# Patient Record
Sex: Female | Born: 1985
Health system: Southern US, Community
[De-identification: ages and names within clinical notes are randomized; demographics above are authoritative.]

## PROBLEM LIST (undated history)

## (undated) DIAGNOSIS — T7840XA Allergy, unspecified, initial encounter: Secondary | ICD-10-CM

## (undated) DIAGNOSIS — N390 Urinary tract infection, site not specified: Secondary | ICD-10-CM

## (undated) DIAGNOSIS — B019 Varicella without complication: Secondary | ICD-10-CM

## (undated) DIAGNOSIS — N926 Irregular menstruation, unspecified: Secondary | ICD-10-CM

## (undated) DIAGNOSIS — J301 Allergic rhinitis due to pollen: Secondary | ICD-10-CM

## (undated) HISTORY — DX: Irregular menstruation, unspecified: N92.6

## (undated) HISTORY — DX: Urinary tract infection, site not specified: N39.0

## (undated) HISTORY — DX: Allergic rhinitis due to pollen: J30.1

## (undated) HISTORY — DX: Varicella without complication: B01.9

## (undated) HISTORY — DX: Allergy, unspecified, initial encounter: T78.40XA

---
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
## 2006-05-17 ENCOUNTER — Emergency Department (HOSPITAL_COMMUNITY): Admission: EM | Admit: 2006-05-17 | Discharge: 2006-05-17 | Payer: Self-pay | Admitting: Family Medicine

## 2007-08-07 ENCOUNTER — Emergency Department (HOSPITAL_COMMUNITY): Admission: EM | Admit: 2007-08-07 | Discharge: 2007-08-07 | Payer: Self-pay | Admitting: Emergency Medicine

## 2009-05-25 ENCOUNTER — Emergency Department (HOSPITAL_COMMUNITY): Admission: EM | Admit: 2009-05-25 | Discharge: 2009-05-25 | Payer: Self-pay | Admitting: Family Medicine

## 2011-03-11 ENCOUNTER — Inpatient Hospital Stay (HOSPITAL_COMMUNITY)
Admission: RE | Admit: 2011-03-11 | Discharge: 2011-03-11 | Disposition: A | Discharge status: Home / Self Care | Payer: Medicaid Other | Source: Ambulatory Visit

## 2011-03-11 ENCOUNTER — Inpatient Hospital Stay (INDEPENDENT_AMBULATORY_CARE_PROVIDER_SITE_OTHER)
Admission: RE | Admit: 2011-03-11 | Discharge: 2011-03-11 | Disposition: A | Discharge status: Home / Self Care | Payer: Medicaid Other | Source: Ambulatory Visit | Attending: Family Medicine | Admitting: Family Medicine

## 2011-03-11 DIAGNOSIS — J029 Acute pharyngitis, unspecified: Secondary | ICD-10-CM

## 2011-04-05 NOTE — Discharge Summary (Signed)
NAMELISSY, DEUSER             ACCOUNT NO.:  [id number]   MEDICAL RECORD NO.:  [id number]          PATIENT TYPE:  INP   LOCATION:  9106                          FACILITY:  [facility]   PHYSICIAN:  Malachi Pro. Ambrose Mantle, M.D. DATE OF BIRTH:  [birth year]   DATE OF ADMISSION:  05/18/2005  DATE OF DISCHARGE:  05/21/2005                                 DISCHARGE SUMMARY   This 25 year old white female para 0, gravida 1, 38+ weeks by 11-week  ultrasound with EDC/ May 29, 2005 presented with complaints of regular  contractions, no vaginal bleeding or rupture of membranes, good fetal  movement.  She was evaluated in maternity admission unit.  She was 9 cm  dilated with bulging bat of water.   Prenatal course, past medical history, surgical history were unremarkable.  Blood group and type O positive, negative antibody, RPR nonreactive, rubella  immune, hepatitis B surface antigen negative, HIV negative.  GC and  chlamydia negative.  Triple screen normal.  One-hour Glucola 107, group B  strep negative.   On admission, her examination showed normal vital signs.  Heart and lungs  were normal.  Abdomen was gravid.  Estimated fetal weight about 7-1/2  pounds.  Contractions every three to four minutes.  Fetal heart tones  reactive.  Cervix 9 cm and completely effaced, vertex at a zero station.  Artificial rupture of membranes produced clear fluid.  Artificial rupture of  membranes was done for augmentation.  The patient progressed to complete  dilatation and pushed well for approximately two hours.  Fetal heart rate  developed variable decelerations with vertex at a +3 station.  Dr. Jackelyn Knife  discussed the risks of assisted delivery.  The patient consented.  The  bladder was drained 30 minutes prior with the vertex at a +3 station, LOA  position, and good epidural.  The M-cup vacuum was applied and with three  pulls the vertex was brought to a +5 station.  Vertex delivered without the  vacuum.  It was a  viable female infant, 8 pounds 0 ounces with Apgars of 8 at  1 and 9 at 5 minutes.  Loose nuchal cord x1 was reduced.  Placenta was  spontaneous and intact.  Small second-degree laceration on the right  repaired with 3-0 Vicryl.  Blood loss was less than 500 mL.  Postpartum, the  patient did well.  She had occasional blood pressures in the 130/90 range.  She had no symptoms of headache or any other signs of pre-eclampsia and on  the second postpartum day was discharged.  Initial hemoglobin was 12.8,  hematocrit 37.5, white count 13,300, platelet count 178,000.  Followup  hemoglobin 10.2, hematocrit 30.2, white count 10,600, platelet count  131,000.  RPR was nonreactive.   FINAL DIAGNOSIS:  Intrauterine pregnancy at 38+ weeks delivered LOA.   OPERATION:  Vacuum assisted vaginal delivery with repair of second degree  laceration.   FINAL CONDITION:  Improved.   DISCHARGE INSTRUCTIONS:  Regular discharge instruction booklet.  The patient  is advised to return to the office in six weeks for followup examination and  Percocet 5/325, 20 tablets,  1 every 4-6 hours as needed for pain is given at  discharge.     TFH/MEDQ  D:  05/21/2005  T:  05/21/2005  Job:  [id number]

## 2011-05-22 ENCOUNTER — Emergency Department (HOSPITAL_COMMUNITY)
Admission: EM | Admit: 2011-05-22 | Discharge: 2011-05-22 | Disposition: A | Discharge status: Home / Self Care | Payer: Medicaid Other | Attending: Emergency Medicine | Admitting: Emergency Medicine

## 2011-05-22 DIAGNOSIS — N12 Tubulo-interstitial nephritis, not specified as acute or chronic: Secondary | ICD-10-CM | POA: Insufficient documentation

## 2011-05-22 DIAGNOSIS — R197 Diarrhea, unspecified: Secondary | ICD-10-CM | POA: Insufficient documentation

## 2011-05-22 DIAGNOSIS — R630 Anorexia: Secondary | ICD-10-CM | POA: Insufficient documentation

## 2011-05-22 DIAGNOSIS — R509 Fever, unspecified: Secondary | ICD-10-CM | POA: Insufficient documentation

## 2011-05-22 DIAGNOSIS — R42 Dizziness and giddiness: Secondary | ICD-10-CM | POA: Insufficient documentation

## 2011-05-22 DIAGNOSIS — R112 Nausea with vomiting, unspecified: Secondary | ICD-10-CM | POA: Insufficient documentation

## 2011-05-22 DIAGNOSIS — R259 Unspecified abnormal involuntary movements: Secondary | ICD-10-CM | POA: Insufficient documentation

## 2011-05-22 DIAGNOSIS — R109 Unspecified abdominal pain: Secondary | ICD-10-CM | POA: Insufficient documentation

## 2011-05-22 LAB — URINALYSIS, ROUTINE W REFLEX MICROSCOPIC
Glucose, UA: NEGATIVE mg/dL
Hgb urine dipstick: NEGATIVE
Ketones, ur: 15 mg/dL — AB
Nitrite: NEGATIVE
Protein, ur: 30 mg/dL — AB
Specific Gravity, Urine: >1.03 — ABNORMAL HIGH (ref 1.005–1.030)
Urobilinogen, UA: 1 mg/dL (ref 0.0–1.0)
pH: 6 (ref 5.0–8.0)

## 2011-05-22 LAB — URINE MICROSCOPIC-ADD ON

## 2011-05-22 LAB — GLUCOSE, CAPILLARY: Glucose-Capillary: 94 mg/dL (ref 70–99)

## 2011-05-22 LAB — POCT PREGNANCY, URINE: Preg Test, Ur: NEGATIVE

## 2013-09-15 ENCOUNTER — Ambulatory Visit (INDEPENDENT_AMBULATORY_CARE_PROVIDER_SITE_OTHER): Payer: BC Managed Care – PPO | Admitting: Family

## 2013-09-15 ENCOUNTER — Encounter: Payer: Self-pay | Admitting: Family

## 2013-09-15 ENCOUNTER — Ambulatory Visit (INDEPENDENT_AMBULATORY_CARE_PROVIDER_SITE_OTHER)
Admission: RE | Admit: 2013-09-15 | Discharge: 2013-09-15 | Disposition: A | Discharge status: Home / Self Care | Payer: BC Managed Care – PPO | Source: Ambulatory Visit | Attending: Family | Admitting: Family

## 2013-09-15 VITALS — BP 108/56 | HR 88 | Ht 62.5 in | Wt 154.0 lb

## 2013-09-15 DIAGNOSIS — M25571 Pain in right ankle and joints of right foot: Secondary | ICD-10-CM

## 2013-09-15 DIAGNOSIS — S99911A Unspecified injury of right ankle, initial encounter: Secondary | ICD-10-CM

## 2013-09-15 DIAGNOSIS — S99919A Unspecified injury of unspecified ankle, initial encounter: Secondary | ICD-10-CM

## 2013-09-15 DIAGNOSIS — S8990XA Unspecified injury of unspecified lower leg, initial encounter: Secondary | ICD-10-CM

## 2013-09-15 DIAGNOSIS — M25579 Pain in unspecified ankle and joints of unspecified foot: Secondary | ICD-10-CM

## 2013-09-15 NOTE — Progress Notes (Signed)
  Subjective:    Patient ID: Annette Pierce, female    DOB: 05-27-1986, 27 y.o.   MRN: [id number]  HPI 27 year old white female, new patient to the practice and to be established. She had concerns of right ankle pain after an injury she sustained in April 2014. Patient reports jump in and about the house spell, sprained her ankle, had significant swelling at that time. She did not seek medical attention. Subsequently, things got better and in July 2014 she rolled her ankle causing more swelling. Since that time, she continued to have stiffness in her right ankle. She has pain typically on a daily basis at rates from 1-5/10. She takes ibuprofen that helps. Pain is typically worse in the twisting her ankle inward.   Review of Systems  Constitutional: Negative.   Respiratory: Negative.   Cardiovascular: Negative.   Musculoskeletal: Positive for arthralgias.       Right ankle pain  Skin: Negative.   Neurological: Negative.   Psychiatric/Behavioral: Negative.    Past Medical History  Diagnosis Date  . Allergy   . Irregular periods     History   Social History  . Marital Status: Single    Spouse Name: N/A    Number of Children: N/A  . Years of Education: N/A   Occupational History  . Not on file.   Social History Main Topics  . Smoking status: Never Smoker   . Smokeless tobacco: Not on file  . Alcohol Use: Yes  . Drug Use: No  . Sexual Activity: Not on file   Other Topics Concern  . Not on file   Social History Narrative  . No narrative on file    History reviewed. No pertinent past surgical history.  No family history on file.  No Known Allergies  No current outpatient prescriptions on file prior to visit.   No current facility-administered medications on file prior to visit.    BP 108/56  Pulse 88  Ht 5' 2.5" (1.588 m)  Wt 154 lb (69.854 kg)  BMI 27.7 kg/m2  LMP 08/29/2014chart    Objective:   Physical Exam  Constitutional: She is oriented to person,  place, and time. She appears well-developed and well-nourished.  Neck: Normal range of motion. Neck supple.  Cardiovascular: Normal rate, regular rhythm and normal heart sounds.   Pulmonary/Chest: Effort normal and breath sounds normal.  Musculoskeletal: She exhibits tenderness.  Right ankle pain  Neurological: She is alert and oriented to person, place, and time.  Skin: Skin is warm and dry.  Psychiatric: She has a normal mood and affect.          Assessment & Plan:  Assessment: 1. Right ankle pain 2. Right ankle injury  Plan: X-ray of the right ankle and an outpatient and results. Encourage ankle exercises to help strengthen the tendons and ligaments. followup pending x-ray. Consider referral to orthopedics if necessary.

## 2013-09-15 NOTE — Patient Instructions (Signed)
Ankle Exercises for Rehabilitation Following ankle injuries, it is as important to follow your caregivers instructions for regaining full use of your ankle as it was to follow the initial treatment plan following the injury. The following are some suggestions for exercises and treatment, which can be done to help you regain full use of your ankle as soon as possible.  Follow all instructions regarding physical therapy.  Before exercising, it may be helpful to use heat on the muscles or joint being exercised. This loosens up the muscles and tendons (cord like structure) and decreases chances of injury during your exercises. If this is not possible just begin your exercises slowly to gradually warm up.  Stand on your toes several times per dayto strengthen the calf muscles. These are the muscles in the back of your leg between the knee and the heel. The cord you can feel just above the heel is the Achilles tendon. Rise up on your toes several times repeating this three to four times per day. Do not exercise to the point of pain. If pain starts to develop, decrease the exercise until you are comfortable again.  Do range of motion exercises. This means moving the ankle in all directions. Practice writing the alphabet with your toes in the air. Do not increase beyond a range that is comfortable.  Increase the strength of the muscles in the front of your leg by raising your toes and foot straight up in the air. Repeat this exercise as you did the calf exercise with the same warnings. This also help to stretch your muscles.  Stretch your calf muscles also by leaning against a wall with your hands in front of you. Put your feet a few feet from the wall and bend your knees until you feel the muscles in your calves become tight.  After exercising it may be helpful to put ice on the ankle to prevent swelling and improve rehabilitation. This may be done for 15 to 20 minutes following your exercises. If exercising  is being done in the work place, this may not always be possible.  Taping an ankle injury may be helpful to give added support following an injury. It also may help prevent re-injury. This may be true if you are in training or in a conditioning program. You and your caregiver can decide on the best course of action to follow. Document Released: 11/01/2000 Document Revised: 01/27/2012 Document Reviewed: 10/29/2008 [employer] Patient Information 2014 [employer].  

## 2015-02-14 IMAGING — CR DG ANKLE COMPLETE 3+V*R*
3 series · 3 of 3 positions shown · non-contrast
Comparison: None.

CLINICAL DATA: Fall, pain, swelling.

EXAM:
RIGHT ANKLE - COMPLETE 3+ VIEW

[view not recorded (1 of 3)]
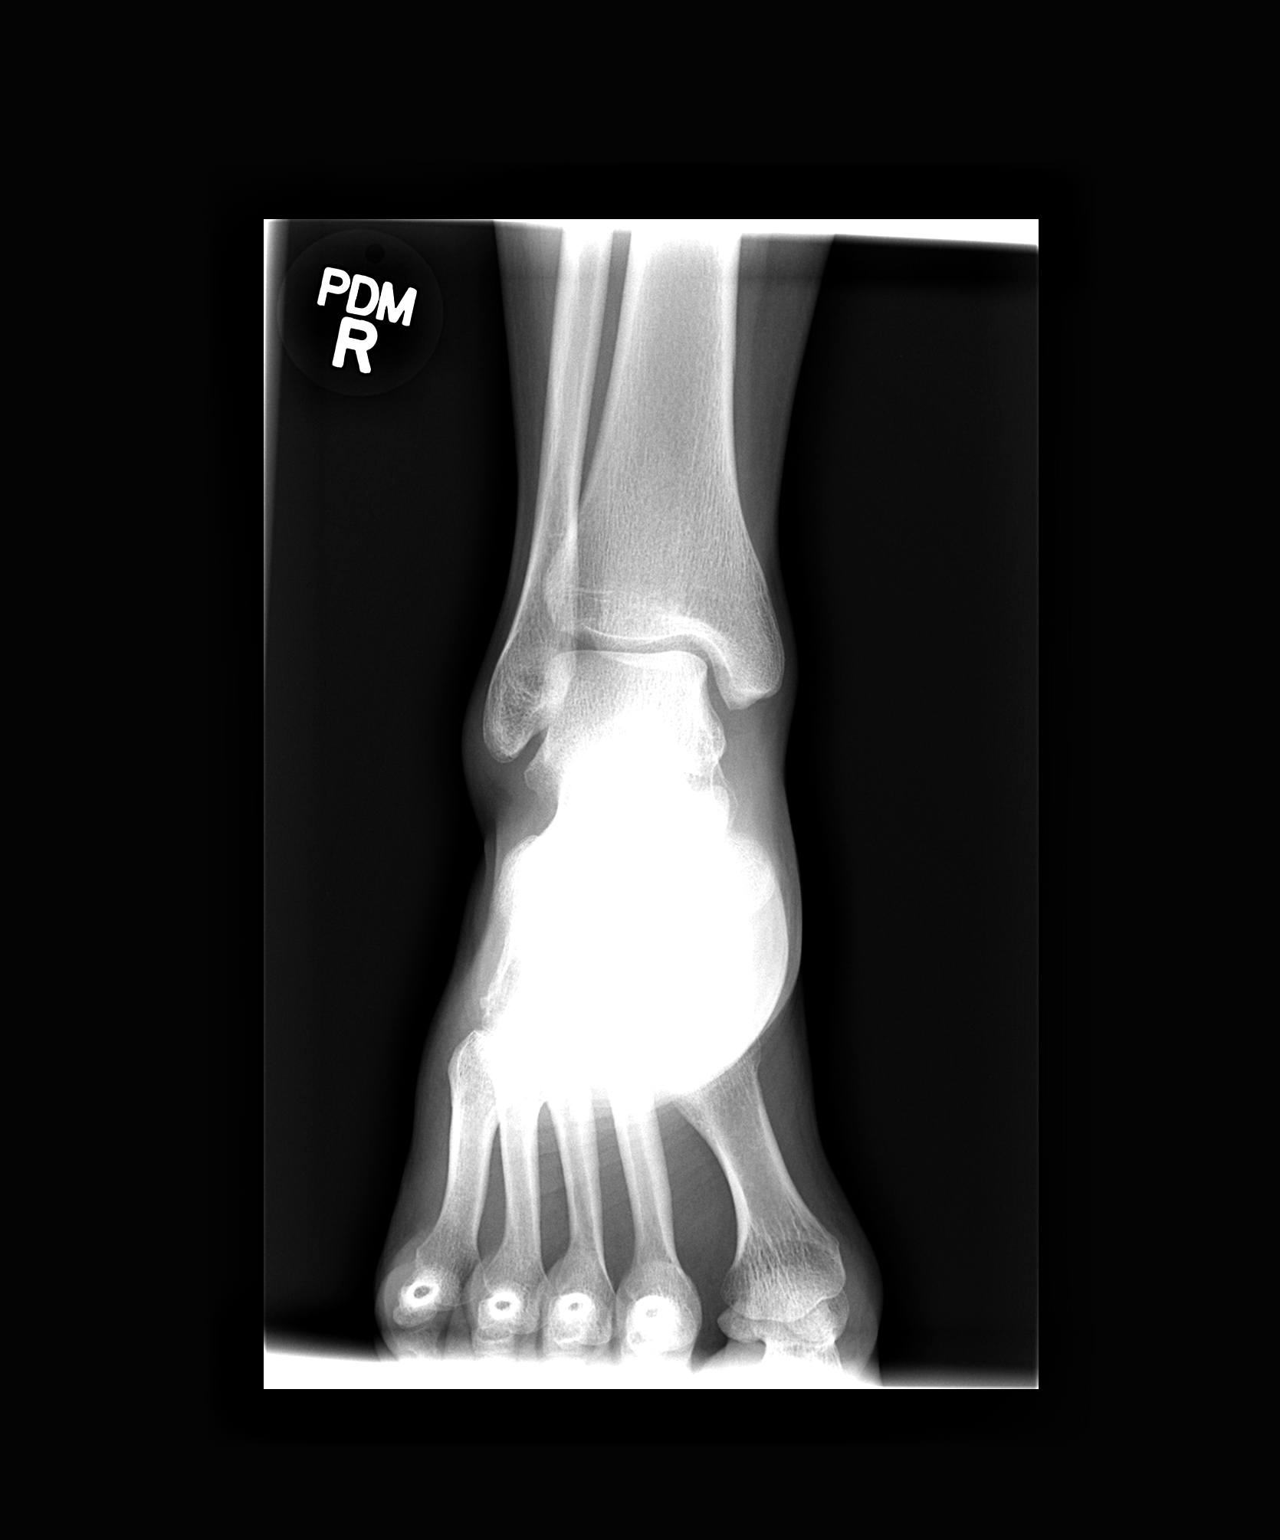

[view not recorded (2 of 3)]
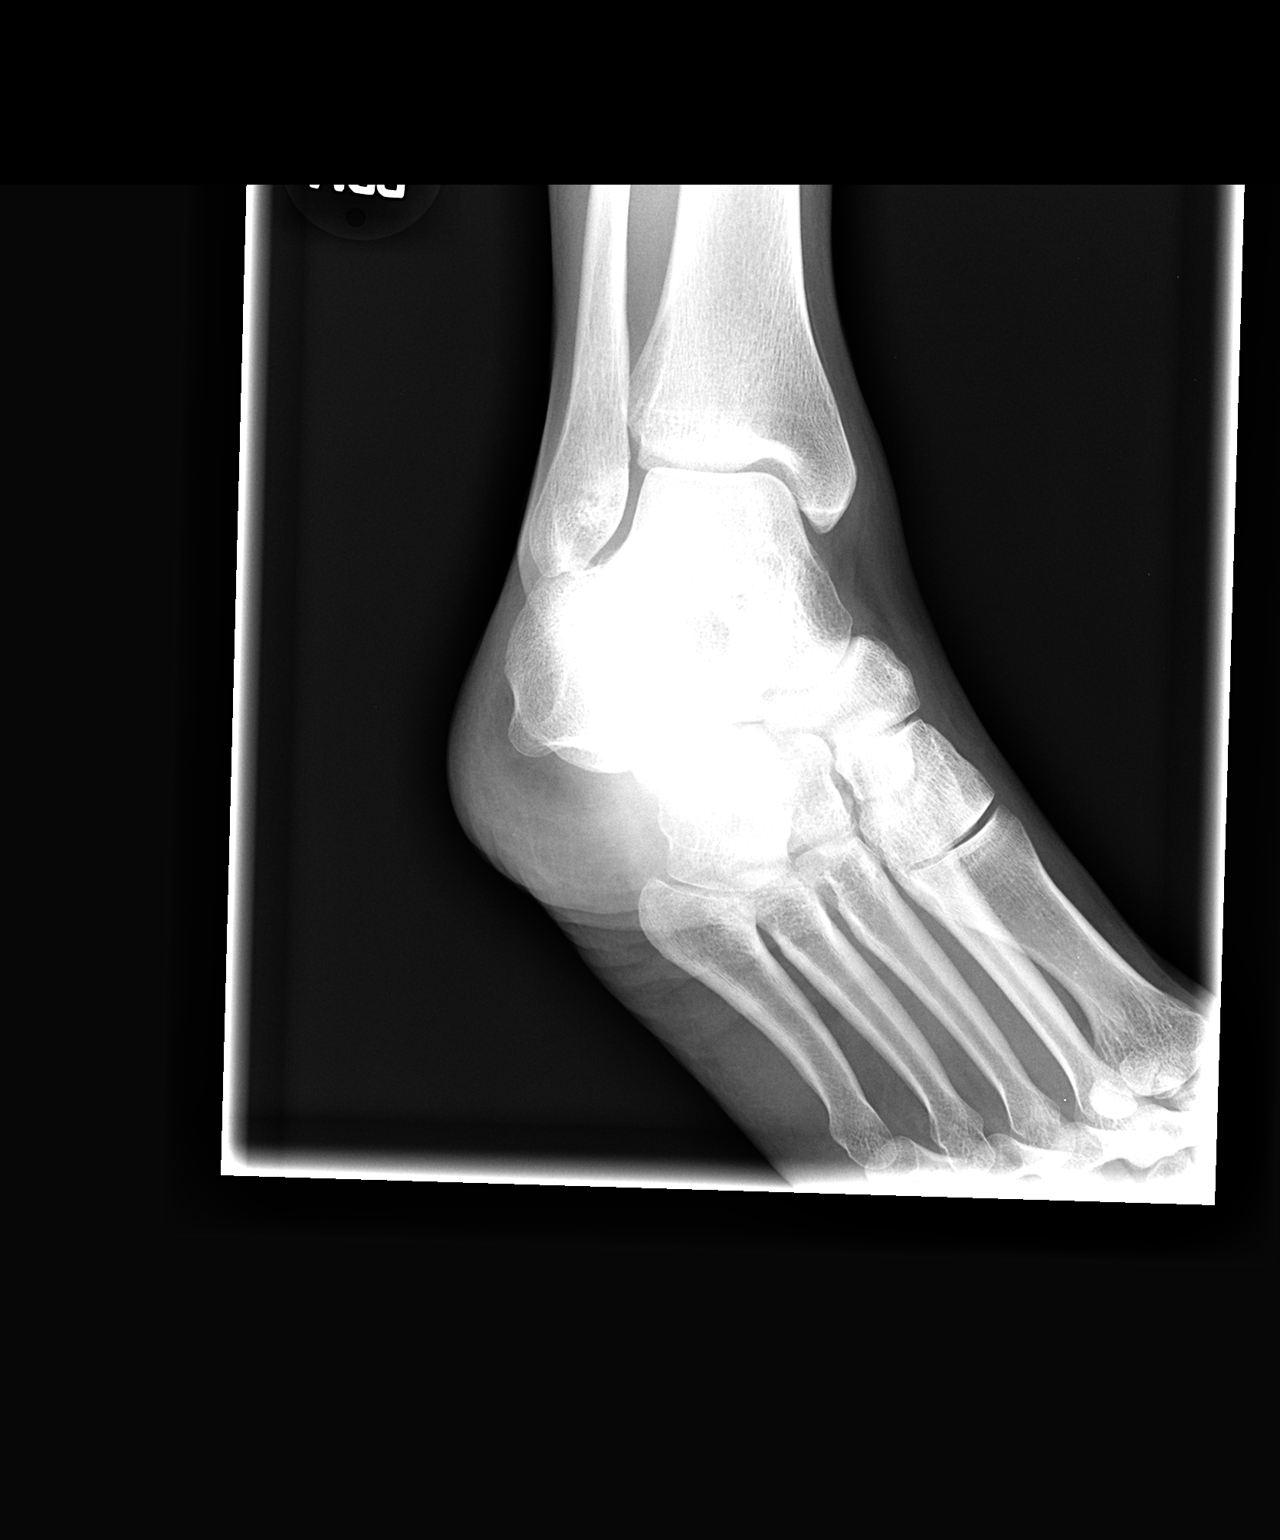

[view not recorded (3 of 3)]
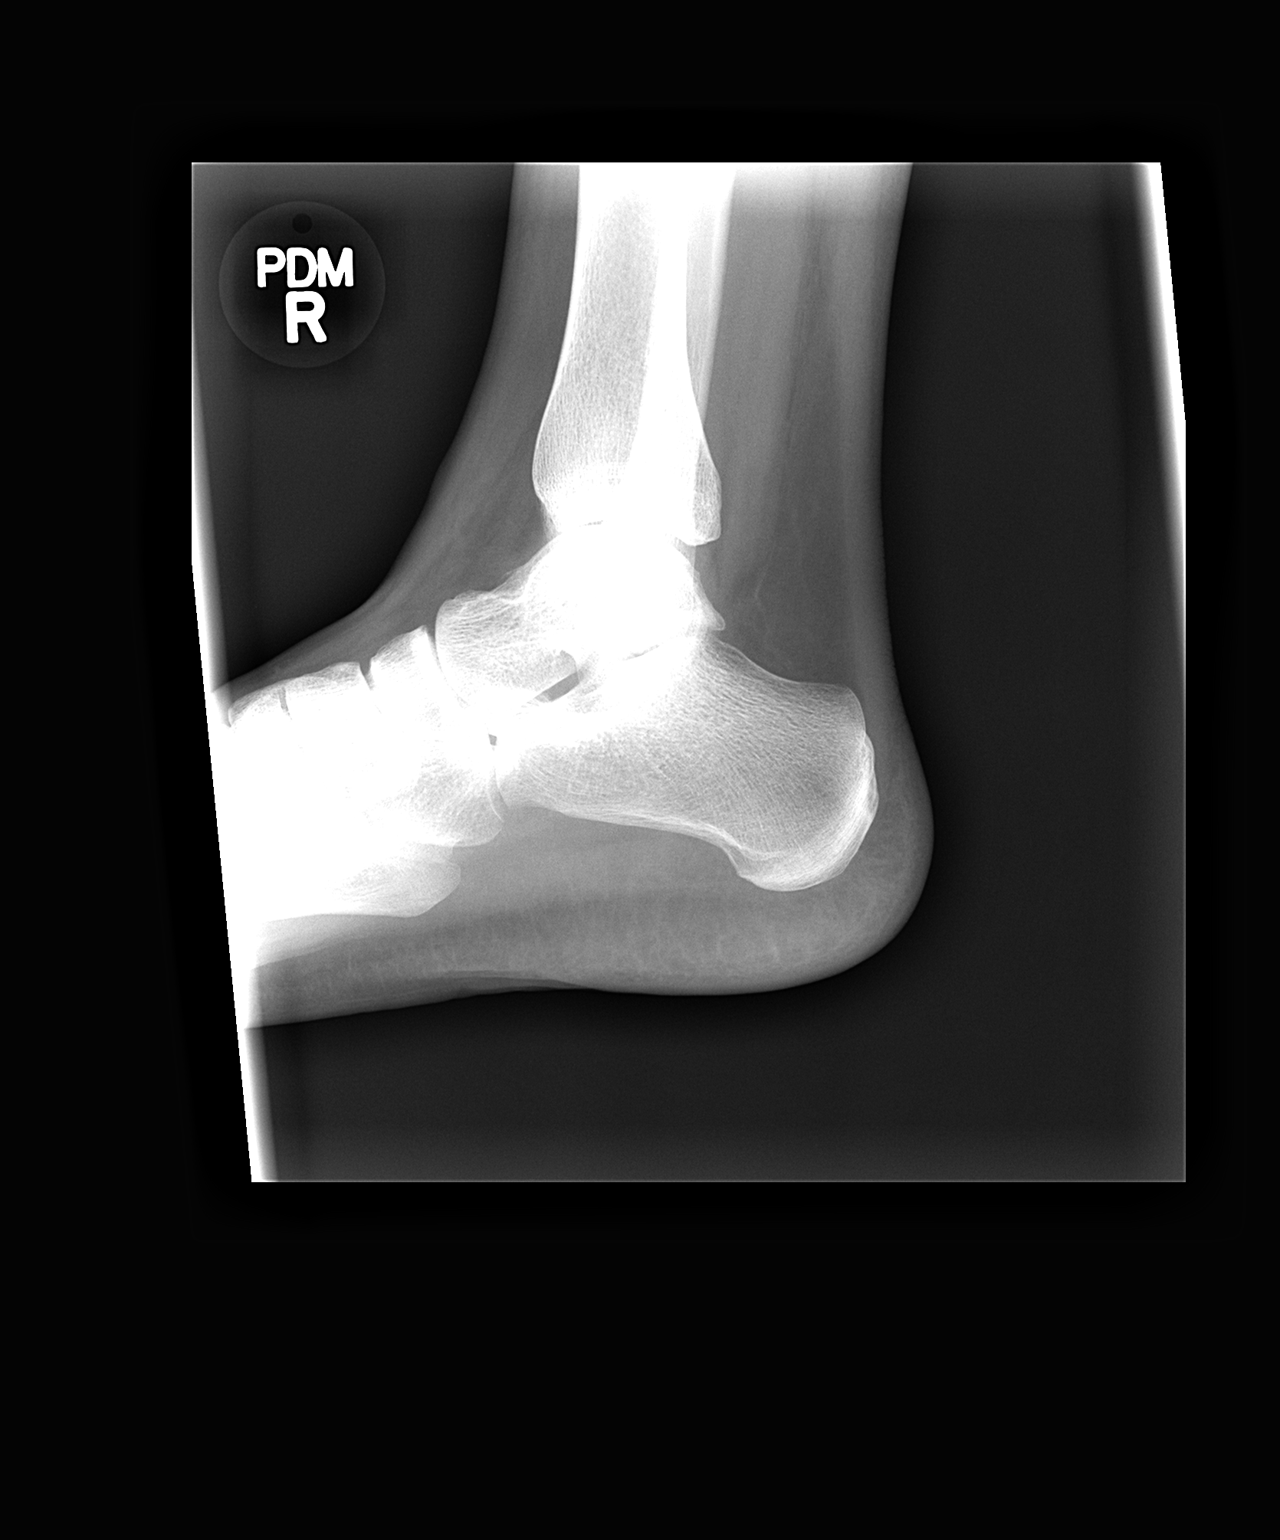

[3 of 3 positions shown; findings below may reference images not displayed]

FINDINGS: There is no evidence of fracture, dislocation, or joint effusion.
There is no evidence of arthropathy or other focal bone abnormality.
Soft tissues are unremarkable.
IMPRESSION: Negative.

## 2015-08-28 ENCOUNTER — Emergency Department (INDEPENDENT_AMBULATORY_CARE_PROVIDER_SITE_OTHER)
Admission: EM | Admit: 2015-08-28 | Discharge: 2015-08-28 | Disposition: A | Discharge status: Home / Self Care | Payer: BLUE CROSS/BLUE SHIELD | Source: Home / Self Care | Attending: Family Medicine | Admitting: Family Medicine

## 2015-08-28 ENCOUNTER — Encounter (HOSPITAL_COMMUNITY): Payer: Self-pay | Admitting: *Deleted

## 2015-08-28 DIAGNOSIS — S40811A Abrasion of right upper arm, initial encounter: Secondary | ICD-10-CM | POA: Diagnosis not present

## 2015-08-28 LAB — POCT PREGNANCY, URINE: PREG TEST UR: NEGATIVE

## 2015-08-28 MED ORDER — TETANUS-DIPHTH-ACELL PERTUSSIS 5-2.5-18.5 LF-MCG/0.5 IM SUSP
0.5000 mL | Freq: Once | INTRAMUSCULAR | Status: AC
  Administered 2015-08-28: 0.5 mL via INTRAMUSCULAR

## 2015-08-28 MED ORDER — TETANUS-DIPHTH-ACELL PERTUSSIS 5-2.5-18.5 LF-MCG/0.5 IM SUSP
INTRAMUSCULAR | Status: AC
  Filled 2015-08-28: qty 0.5

## 2015-08-28 NOTE — ED Notes (Signed)
Pt  Sustained  A  Superficial  laceration to  r   Arm  Today  From a  Rusty  Dumpster  Bleeding has  Subsided

## 2015-08-28 NOTE — ED Provider Notes (Signed)
CSN: [id number]     Arrival date & time 08/28/15  1646 History   First MD Initiated Contact with Patient 08/28/15 1810     Chief Complaint  Patient presents with  . Laceration   (Consider location/radiation/quality/duration/timing/severity/associated sxs/prior Treatment) Patient is a 29 y.o. female presenting with skin laceration. The history is provided by the patient.  Laceration Location:  Shoulder/arm Shoulder/arm laceration location:  R forearm Length (cm):  5 Depth:  Cutaneous Quality comment:  Scrape to skin Bleeding: controlled   Laceration mechanism:  Metal edge (throwing box into dumpster and scraped right forearm.) Pain details:    Severity:  No pain   Progression:  Unchanged Foreign body present:  No foreign bodies Relieved by:  Nothing Worsened by:  Nothing tried Ineffective treatments:  None tried Tetanus status:  Out of date   Past Medical History  Diagnosis Date  . Allergy   . Irregular periods    No past surgical history on file. No family history on file. Social History  Substance Use Topics  . Smoking status: Never Smoker   . Smokeless tobacco: Not on file  . Alcohol Use: Yes   OB History    No data available     Review of Systems  Skin: Positive for wound.  All other systems reviewed and are negative.   Allergies  Review of patient's allergies indicates no known allergies.  Home Medications   Prior to Admission medications   Not on File   Meds Ordered and Administered this Visit   Medications  Tdap (BOOSTRIX) injection 0.5 mL (not administered)    BP 131/91 mmHg  Pulse 74  Temp(Src) 98.3 F (36.8 C) (Oral)  Resp 16  SpO2 99% No data found.   Physical Exam  Constitutional: She is oriented to person, place, and time. She appears well-developed and well-nourished.  Musculoskeletal: She exhibits no tenderness.  Neurological: She is alert and oriented to person, place, and time.  Skin: Skin is warm and dry.  5cm superficial  scrape to forearm, no subq penetration, no bleeding.  Nursing note and vitals reviewed.   ED Course  Procedures (including critical care time)  Labs Review Labs Reviewed  POCT PREGNANCY, URINE    Imaging Review No results found.   Visual Acuity Review  Right Eye Distance:   Left Eye Distance:   Bilateral Distance:    Right Eye Near:   Left Eye Near:    Bilateral Near:         MDM   1. Arm abrasion, right, initial encounter        Linna Hoff, MD 08/28/15 [phone]

## 2015-08-28 NOTE — Discharge Instructions (Signed)
Wash regularly, you had a tetanus booster, return as needed.

## 2015-09-27 ENCOUNTER — Encounter: Payer: Self-pay | Admitting: Family Medicine

## 2015-09-27 ENCOUNTER — Telehealth: Payer: Self-pay | Admitting: Family

## 2015-09-27 ENCOUNTER — Ambulatory Visit (INDEPENDENT_AMBULATORY_CARE_PROVIDER_SITE_OTHER): Payer: BLUE CROSS/BLUE SHIELD | Admitting: Family Medicine

## 2015-09-27 VITALS — BP 101/73 | Temp 98.4°F | Ht 62.5 in | Wt 147.0 lb

## 2015-09-27 DIAGNOSIS — T63301A Toxic effect of unspecified spider venom, accidental (unintentional), initial encounter: Secondary | ICD-10-CM

## 2015-09-27 DIAGNOSIS — R51 Headache: Secondary | ICD-10-CM

## 2015-09-27 DIAGNOSIS — R519 Headache, unspecified: Secondary | ICD-10-CM

## 2015-09-27 MED ORDER — TRAMADOL HCL 50 MG PO TABS: 100.0000 mg | ORAL_TABLET | Freq: Three times a day (TID) | ORAL | Status: DC | PRN

## 2015-09-27 MED ORDER — DOXYCYCLINE HYCLATE 100 MG PO TABS: 100.0000 mg | ORAL_TABLET | Freq: Two times a day (BID) | ORAL | Status: DC

## 2015-09-27 NOTE — Progress Notes (Signed)
Pre visit review using our clinic review tool, if applicable. No additional management support is needed unless otherwise documented below in the visit note. 

## 2015-09-27 NOTE — Progress Notes (Signed)
   Subjective:    Patient ID: Annette Pierce, female    DOB: 1986-01-21, 29 y.o.   MRN: [id number]  HPI Here for a bite on the left ankle that occurred about 2 weeks ago, and also for headaches that started about one week ago. She was outside in a yard when she felt a sharp bite on the left ankle. She never saw what bit her but she developed a pustule on the site. There was never a rash at the site. No fevers or nausea. This opened and drained about 4 days ago, and at that same time the headaches started. These are centered over the left temple and they generalize over the entire head. No light sensitivity. She has tried Excedrin Migraines, Advils, and allergy meds with no relief.    Review of Systems  Constitutional: Negative.   Eyes: Negative.   Respiratory: Negative.   Skin: Positive for wound.  Neurological: Positive for headaches.       Objective:   Physical Exam  Constitutional: She is oriented to person, place, and time. She appears well-developed and well-nourished. No distress.  HENT:  Head: Normocephalic and atraumatic.  Right Ear: External ear normal.  Left Ear: External ear normal.  Nose: Nose normal.  Eyes: Conjunctivae are normal.  Pulmonary/Chest: Effort normal and breath sounds normal.  Lymphadenopathy:    She has no cervical adenopathy.  Neurological: She is alert and oriented to person, place, and time. She has normal reflexes. No cranial nerve deficit. She exhibits normal muscle tone. Coordination normal.  Skin:  Small red macular spot on the medial left ankle           Assessment & Plan:  She has likely received a spider bite and we will cover this with Doxycycline. It is not clear if this is related to the bite or not. Use Tramadol for pain

## 2015-09-27 NOTE — Telephone Encounter (Signed)
PLEASE NOTE: All timestamps contained within this report are represented as [place] Standard Time. CONFIDENTIALTY NOTICE: This fax transmission is intended only for the addressee. It contains information that is legally privileged, confidential or otherwise protected from use or disclosure. If you are not the intended recipient, you are strictly prohibited from reviewing, disclosing, copying using or disseminating any of this information or taking any action in reliance on or regarding this information. If you have received this fax in error, please notify us immediately by telephone so that we can arrange for its return to us. Phone: [phone], Toll-Free: [phone], Fax: [phone] Page: 1 of 1 Call Id: [id number] [facility] - Client TELEPHONE ADVICE RECORD [facility] Patient Name: Annette Pierce DOB: October 12, 1986 Initial Comment Caller States she got bit by something 2 weeks on her foot and the site popped. she has been having migraines since it popped, about a week ago. Nurse Assessment Nurse: Elijah Birkaldwell, RN, Lynda Date/Time (Eastern Time): 09/27/2015 2:28:58 PM Confirm and document reason for call. If symptomatic, describe symptoms. ---Caller states she got bit by something 2 weeks ago on her left foot by ankle and the site popped with pus in it. The bump is still there with scab and is still red & itchy. After it popped, covered it & put antibiotic cream on it. Since then, she has been having constant Migraines for about a week. Has tried ASA, Advil, Excedrin, cold compress. No fever. Has the patient traveled out of the country within the last 30 days? ---Not Applicable Does the patient have any new or worsening symptoms? ---Yes Will a triage be completed? ---Yes Related visit to physician within the last 2 weeks? ---No Does the PT have any chronic conditions? (i.e. diabetes, asthma, etc.) ---No Did the patient indicate they were  pregnant? ---No Guidelines Guideline Title Affirmed Question Affirmed Notes Headache [1] MODERATE headache (e.g., interferes with normal activities) AND [2] present > 24 hours AND [3] unexplained (Exceptions: analgesics not tried, typical migraine, or headache part of viral illness) Final Disposition User See Physician within 24 Hours Keachialdwell, RN, ToysRusLynda Referrals REFERRED TO PCP OFFICE Disagree/Comply: Danella Maiersomply

## 2015-09-28 NOTE — Telephone Encounter (Signed)
Pt saw Dr Clent RidgesFry yesterday 09/27/15 and she is feeling better today

## 2016-07-01 ENCOUNTER — Ambulatory Visit (INDEPENDENT_AMBULATORY_CARE_PROVIDER_SITE_OTHER): Payer: BLUE CROSS/BLUE SHIELD | Admitting: Family Medicine

## 2016-07-01 VITALS — BP 110/80 | HR 71 | Temp 98.2°F | Ht 62.5 in | Wt 151.0 lb

## 2016-07-01 DIAGNOSIS — R21 Rash and other nonspecific skin eruption: Secondary | ICD-10-CM

## 2016-07-01 MED ORDER — TRIAMCINOLONE ACETONIDE 0.1 % EX CREA: 1.0000 "application " | TOPICAL_CREAM | Freq: Two times a day (BID) | CUTANEOUS | 1 refills | Status: DC

## 2016-07-01 NOTE — Progress Notes (Signed)
Pre visit review using our clinic review tool, if applicable. No additional management support is needed unless otherwise documented below in the visit note. 

## 2016-07-01 NOTE — Progress Notes (Signed)
Subjective:     Patient ID: Annette CoxSusan Pierce, female   DOB: 07/25/1986, 30 y.o.   MRN: [id number]  HPI Acute visit for skin rash right upper inner arm. Present for a few weeks. Denies any other areas of rash Rash is pruritic. Has tried over-the-counter hydrocortisone cream without much improvement. Frequently scratches the rash. Exacerbated by heat. Denies any other systemic rash. No other symptoms.  Past Medical History:  Diagnosis Date  . Allergy   . Irregular periods    No past surgical history on file.  reports that she has never smoked. She has never used smokeless tobacco. She reports that she does not drink alcohol or use drugs. family history is not on file. No Known Allergies   Review of Systems  Constitutional: Negative for chills and fever.  Skin: Positive for rash.       Objective:   Physical Exam  Constitutional: She appears well-developed and well-nourished.  Cardiovascular: Normal rate and regular rhythm.   Skin: Rash noted.  Right upper inner arm reveals approximately 3-6 cm area of skin rash which is dry and scaly and excoriated. No vesicles. No pustules. Nontender.       Assessment:     Nonspecific excoriated somewhat lichenified skin rash right upper inner arm    Plan:     -Avoid scratching as much as possible -Triamcinolone 0.1% cream twice daily as needed -Follow-up with primary in 2 weeks if not resolving or improving  Kristian CoveyBruce W Yoshika Vensel MD [facility]

## 2016-07-01 NOTE — Patient Instructions (Signed)
Try to avoid scratching as much as possible. Use the medicated cream twice daily Let us know in 2 weeks if no better.

## 2017-01-30 ENCOUNTER — Ambulatory Visit (INDEPENDENT_AMBULATORY_CARE_PROVIDER_SITE_OTHER): Payer: BLUE CROSS/BLUE SHIELD | Admitting: Family Medicine

## 2017-01-30 ENCOUNTER — Encounter: Payer: Self-pay | Admitting: Family Medicine

## 2017-01-30 VITALS — BP 100/78 | HR 74 | Temp 97.9°F | Wt 153.6 lb

## 2017-01-30 DIAGNOSIS — Z7689 Persons encountering health services in other specified circumstances: Secondary | ICD-10-CM | POA: Diagnosis not present

## 2017-01-30 DIAGNOSIS — J302 Other seasonal allergic rhinitis: Secondary | ICD-10-CM

## 2017-01-30 DIAGNOSIS — K029 Dental caries, unspecified: Secondary | ICD-10-CM | POA: Diagnosis not present

## 2017-01-30 NOTE — Progress Notes (Signed)
Pre visit review using our clinic review tool, if applicable. No additional management support is needed unless otherwise documented below in the visit note. 

## 2017-01-30 NOTE — Patient Instructions (Addendum)
It was a pleasure to meet you today! You can use either Allegra, Claritin, or Zyrtec for symptoms as we discussed. Follow up for a physical and lab work.  Allergic Rhinitis Allergic rhinitis is when the mucous membranes in the nose respond to allergens. Allergens are particles in the air that cause your body to have an allergic reaction. This causes you to release allergic antibodies. Through a chain of events, these eventually cause you to release histamine into the blood stream. Although meant to protect the body, it is this release of histamine that causes your discomfort, such as frequent sneezing, congestion, and an itchy, runny nose. What are the causes? Seasonal allergic rhinitis (hay fever) is caused by pollen allergens that may come from grasses, trees, and weeds. Year-round allergic rhinitis (perennial allergic rhinitis) is caused by allergens such as house dust mites, pet dander, and mold spores. What are the signs or symptoms?  Nasal stuffiness (congestion).  Itchy, runny nose with sneezing and tearing of the eyes. How is this diagnosed? Your health care provider can help you determine the allergen or allergens that trigger your symptoms. If you and your health care provider are unable to determine the allergen, skin or blood testing may be used. Your health care provider will diagnose your condition after taking your health history and performing a physical exam. Your health care provider may assess you for other related conditions, such as asthma, pink eye, or an ear infection. How is this treated? Allergic rhinitis does not have a cure, but it can be controlled by:  Medicines that block allergy symptoms. These may include allergy shots, nasal sprays, and oral antihistamines.  Avoiding the allergen. Hay fever may often be treated with antihistamines in pill or nasal spray forms. Antihistamines block the effects of histamine. There are over-the-counter medicines that may help with  nasal congestion and swelling around the eyes. Check with your health care provider before taking or giving this medicine. If avoiding the allergen or the medicine prescribed do not work, there are many new medicines your health care provider can prescribe. Stronger medicine may be used if initial measures are ineffective. Desensitizing injections can be used if medicine and avoidance does not work. Desensitization is when a patient is given ongoing shots until the body becomes less sensitive to the allergen. Make sure you follow up with your health care provider if problems continue. Follow these instructions at home: It is not possible to completely avoid allergens, but you can reduce your symptoms by taking steps to limit your exposure to them. It helps to know exactly what you are allergic to so that you can avoid your specific triggers. Contact a health care provider if:  You have a fever.  You develop a cough that does not stop easily (persistent).  You have shortness of breath.  You start wheezing.  Symptoms interfere with normal daily activities. This information is not intended to replace advice given to you by your health care provider. Make sure you discuss any questions you have with your health care provider. Document Released: 07/30/2001 Document Revised: 07/05/2016 Document Reviewed: 07/12/2013 Elsevier Interactive Patient Education  2017 [employer]   WE NOW OFFER   [facility] FAST TRACK!!!  SAME DAY Appointments for ACUTE CARE  Such as: Sprains, Injuries, cuts, abrasions, rashes, muscle pain, joint pain, back pain Colds, flu, sore throats, headache, allergies, cough, fever  Ear pain, sinus and eye infections Abdominal pain, nausea, vomiting, diarrhea, upset stomach Animal/insect bites  3 Easy  Ways to Schedule: Walk-In Scheduling Call in scheduling Mychart Sign-up: [email or web address]

## 2017-01-30 NOTE — Progress Notes (Signed)
Patient ID: Annette Pierce, female   DOB: [birth year], 31 y.o.   MRN: [id number]  Patient presents to clinic today to establish care. She reports seeking care rarely and notes that she only sees a PCP when she is sick.   Symptoms of itchy, watery eyes are noted. History of seasonal allergies. She denies fever, chills, sweats, cough, sore throat, post nasal drip, ear pain, or sinus pressure/pain. Treatment with benedryl has provided moderate benefit but causes excessive sleepiness.   Health Maintenance: Dental -- She does not go; Over 10 years ago. No insurance. Will provide local dental community resources Vision -- Last year; She is UTD; Wears contacts Immunizations -- UTD with Tdap; She declines flu influenza today Colonoscopy --None PAP -- Last Pap in Fall 2017.  Followed by gynecology; Pap normal per patient; she reports heavy periods but notes this as "normal" for her and she is followed by gynecology.    ROS  BP 100/78 (BP Location: Left Arm, Patient Position: Sitting, Cuff Size: Normal)   Pulse 74   Temp 97.9 F (36.6 C) (Oral)   Wt 153 lb 9.6 oz (69.7 kg)   LMP 01/23/2017 (Approximate)   SpO2 98%   BMI 27.65 kg/m    Constitutional: No fever, chills, significant weight change, fatigue, weakness or night sweats Eyes: No redness, discharge, pain, blurred vision, double vision, or loss of vision. Positive for itchy, watery eyes ENT/mouth: No nasal congestion, postnasal drainage,epistaxis, purulent discharge, earache, hearing loss, tinnitus ,sore throat , dental pain, or hoarseness   Cardiovascular: no chest pain, palpitations, racing, irregular rhythm, syncope, nausea, sweating, claudication, or edema  Respiratory: No cough, sputum production,hemoptysis,  dyspnea, paroxysmal nocturnal dyspnea, pleuritic chest pain, significant snoring, or  apnea    Gastrointestinal: No heartburn,dysphagia, nausea and vomiting,ominal pain, change in bowels, anorexia, diarrhea, significant  constipation, rectal bleeding, melena,  stool incontinence or jaundice Genitourinary: No dysuria,hematuria, pyuria, frequency, urgency,  incontinence, nocturia, dark urine or flank pain Musculoskeletal: No myalgias or muscle cramping, joint stiffness, joint swelling, joint color change, weakness, or cyanosis Dermatologic: No rash, pruritus, urticaria, or change in color or temperature of skin Neurologic: No headache, vertigo, limb weakness, tremor, gait disturbance, seizures, memory loss, numbness or tingling Psychiatric: No significant anxiety or depression, anhedonia, panic attacks, insomnia, or anorexia Endocrine: No change in hair/skin/ nails, excessive thirst, excessive hunger, excessive urination, or unexplained fatigue Hematologic/lymphatic: No bruising, lymphadenopathy,or  abnormal clotting Allergy/immunology: No itchy/ watery eyes, abnormal sneezing, rhinitis, urticaria ,or angioedema  Past Medical History:  Diagnosis Date  . Allergy   . Irregular periods      Social History   Social History  . Marital status: Single    Spouse name: N/A  . Number of children: N/A  . Years of education: N/A   Occupational History  . sales [employer]   Social History Main Topics  . Smoking status: Never Smoker  . Smokeless tobacco: Never Used  . Alcohol use No  . Drug use: No  . Sexual activity: Yes    Partners: Male   Other Topics Concern  . Not on file   Social History Narrative   Son age 30, works 3 rd shift    History reviewed. No pertinent surgical history.  Family History  Problem Relation Age of Onset  . Hypertension Mother   . Diabetes Maternal Grandmother     No Known Allergies  No current outpatient prescriptions on file prior to visit.   No current facility-administered medications on file prior to visit.  BP 100/78 (BP Location: Left Arm, Patient Position: Sitting, Cuff Size: Normal)   Pulse 74   Temp 97.9 F (36.6 C) (Oral)   Wt 153 lb 9.6 oz (69.7  kg)   LMP 01/23/2017 (Approximate)   SpO2 98%   BMI 27.65 kg/m    Physical Exam  Constitutional: She is oriented to person, place, and time and well-developed, well-nourished, and in no distress.  Obvious caries noted  Eyes: Pupils are equal, round, and reactive to light. No scleral icterus.  Neck: Neck supple.  Cardiovascular: Normal rate, regular rhythm and intact distal pulses.   Pulmonary/Chest: Effort normal and breath sounds normal. She has no wheezes. She has no rales.  Abdominal: Soft. Bowel sounds are normal. There is no tenderness. There is no rebound.  Musculoskeletal: She exhibits no edema.  Lymphadenopathy:    She has no cervical adenopathy.  Neurological: She is alert and oriented to person, place, and time. Gait normal.  Skin: Skin is warm and dry. No rash noted.  Psychiatric:  Denies depressed or anxious mood today    Assessment/Plan:  1. Seasonal allergic rhinitis, unspecified chronicity, unspecified trigger Allegra, Claritin, or Zyrtec advised instead of benedryl due to sleepiness with medication.  2. Dental decay Obvious decay noted on teeth; Lack of dental care due to lack of insurance and finances. Provided written list of low cost/sliding scale dental community resources for patient today.  3. Encounter to establish care We reviewed the PMH, PSH, FH, SH, Meds and Allergies. -We provided refills for any medications we will prescribe as needed:  None needed -We addressed current concerns per orders and patient instructions. -We have asked for records for pertinent exams, studies, vaccines and notes from previous providers. -We have advised patient to follow up per instructions below.   -Patient advised to return for physical and lab work in the next 6 months. She reports working 3rd shift last night and recently had a meal and needs sleep today before working again [employer].  Roddie Mc, FNP-C

## 2017-07-28 ENCOUNTER — Other Ambulatory Visit: Payer: Self-pay | Admitting: *Deleted

## 2017-07-28 ENCOUNTER — Ambulatory Visit (INDEPENDENT_AMBULATORY_CARE_PROVIDER_SITE_OTHER): Payer: BLUE CROSS/BLUE SHIELD | Admitting: Family Medicine

## 2017-07-28 ENCOUNTER — Encounter: Payer: Self-pay | Admitting: Family Medicine

## 2017-07-28 VITALS — BP 98/78 | HR 87 | Temp 98.4°F | Ht 62.5 in | Wt 156.2 lb

## 2017-07-28 DIAGNOSIS — J302 Other seasonal allergic rhinitis: Secondary | ICD-10-CM

## 2017-07-28 DIAGNOSIS — H00012 Hordeolum externum right lower eyelid: Secondary | ICD-10-CM | POA: Diagnosis not present

## 2017-07-28 MED ORDER — ERYTHROMYCIN 5 MG/GM OP OINT: 1.0000 "application " | TOPICAL_OINTMENT | Freq: Every day | OPHTHALMIC | 0 refills | Status: DC

## 2017-07-28 NOTE — Progress Notes (Signed)
  HPI:   Acute visit for:  Seasonal allergies/right lower eyelid swelling: -Reports uncontrolled allergies - Nasal congestion, itchy runny eyes, postnasal drip- usually worse this time in the year -Takes over-the-counter medicines sporadically -Occasionally gets swollen eyelids, usually the right lower lid -Occasional pus, none today -This lid swelling started a few days ago -Denies vision changes, pus, fevers, malaise or eye pain, lid is tender  ROS: See pertinent positives and negatives per HPI.  Past Medical History:  Diagnosis Date  . Allergy   . Chicken pox   . Hay fever   . Irregular periods   . Urinary tract infection     No past surgical history on file.  Family History  Problem Relation Age of Onset  . Hypertension Mother   . Diabetes Maternal Grandmother   . Hypertension Maternal Grandmother     Social History   Social History  . Marital status: Single    Spouse name: N/A  . Number of children: N/A  . Years of education: N/A   Occupational History  . sales [employer]   Social History Main Topics  . Smoking status: Never Smoker  . Smokeless tobacco: Never Used  . Alcohol use No  . Drug use: No  . Sexual activity: Yes    Partners: Male   Other Topics Concern  . None   Social History Narrative   Son age 30, works 3 rd shift     Current Outpatient Prescriptions:  .  erythromycin ophthalmic ointment, Place 1 application into the right eye at bedtime., Disp: 3.5 g, Rfl: 0  EXAM:  Vitals:   07/28/17 0938  BP: 98/78  Pulse: 87  Temp: 98.4 F (36.9 C)    Body mass index is 28.11 kg/m.  GENERAL: vitals reviewed and listed above, alert, oriented, appears well hydrated and in no acute distress  HEENT: atraumatic, conjunttiva with mild erythema bilaterally,right lower lid is mildly swollen with mild erythema, PERRLA, EOMI, visual acuity grossly intact,no obvious abnormalities on inspection of external nose and ears, normal appearance of ear  canals and TMs, clear nasal congestion, mild post oropharyngeal erythema with PND, no tonsillar edema or exudate, no sinus TTP  NECK: no obvious masses on inspection  MS: moves all extremities without noticeable abnormality  PSYCH: pleasant and cooperative, no obvious depression or anxiety  ASSESSMENT AND PLAN:  Discussed the following assessment and plan:  Hordeolum externum of right lower eyelid  Seasonal allergic rhinitis, unspecified trigger  -allergy regimen -Compresses -Saline -Eye ointment as needed -Needs new PCP, advised to schedule transfer visit -Patient advised to return or notify a doctor immediately if symptoms worsen or persist or new concerns arise.  Patient Instructions  BEFORE YOU LEAVE: -follow up: schedule transfer visit with Dr. Salomon FickBanks, Dr. [place]Jordan or( Dr. Selena BattenKim) per pt preference.  Compresses several times daily.  Nasal saline twice daily.  Flonase 2 sprays each nostril daily for 1 month, then 1 spray each nostril daily.  Eye ointment if any pus or if needed.  I hope you are feeling better soon! Follow up if worsening, new concerns or you are not improving with treatment.       Kriste BasqueKIM, HANNAH R., DO

## 2017-07-28 NOTE — Telephone Encounter (Signed)
Rx re-sent to [facility]

## 2017-07-28 NOTE — Patient Instructions (Signed)
BEFORE YOU LEAVE: -follow up: schedule transfer visit with Dr. Salomon FickBanks, Dr. [place]Jordan or( Dr. Selena BattenKim) per pt preference.  Compresses several times daily.  Nasal saline twice daily.  Flonase 2 sprays each nostril daily for 1 month, then 1 spray each nostril daily.  Eye ointment if any pus or if needed.  I hope you are feeling better soon! Follow up if worsening, new concerns or you are not improving with treatment.

## 2017-08-27 NOTE — Progress Notes (Deleted)
   HPI:  Annette Pierce is here to establish care. Transfer from [place]. Last PCP and physical:  Has the following chronic problems that require follow up and concerns today:   ROS negative for unless reported above: fevers, unintentional weight loss, hearing or vision loss, chest pain, palpitations, struggling to breath, hemoptysis, melena, hematochezia, hematuria, falls, loc, si, thoughts of self harm  Past Medical History:  Diagnosis Date  . Allergy   . Chicken pox   . Hay fever   . Irregular periods   . Urinary tract infection     No past surgical history on file.  Family History  Problem Relation Age of Onset  . Hypertension Mother   . Diabetes Maternal Grandmother   . Hypertension Maternal Grandmother     Social History   Social History  . Marital status: Single    Spouse name: N/A  . Number of children: N/A  . Years of education: N/A   Occupational History  . sales [employer]   Social History Main Topics  . Smoking status: Never Smoker  . Smokeless tobacco: Never Used  . Alcohol use No  . Drug use: No  . Sexual activity: Yes    Partners: Male   Other Topics Concern  . Not on file   Social History Narrative   Annette Pierce age 68, works 3 rd shift     Current Outpatient Prescriptions:  .  erythromycin ophthalmic ointment, Place 1 application into the right eye at bedtime., Disp: 3.5 g, Rfl: 0  EXAM:  There were no vitals filed for this visit.  There is no height or weight on file to calculate BMI.  GENERAL: vitals reviewed and listed above, alert, oriented, appears well hydrated and in no acute distress  HEENT: atraumatic, conjunttiva clear, no obvious abnormalities on inspection of external nose and ears  NECK: no obvious masses on inspection  LUNGS: clear to auscultation bilaterally, no wheezes, rales or rhonchi, good air movement  CV: HRRR, no peripheral edema  MS: moves all extremities without noticeable abnormality  PSYCH:  pleasant and cooperative, no obvious depression or anxiety  ASSESSMENT AND PLAN:  Discussed the following assessment and plan:  No diagnosis found. -We reviewed the PMH, PSH, FH, SH, Meds and Allergies. -We provided refills for any medications we will prescribe as needed. -We addressed current concerns per orders and patient instructions. -We have asked for records for pertinent exams, studies, vaccines and notes from previous providers. -We have advised patient to follow up per instructions below.   -Patient advised to return or notify a doctor immediately if symptoms worsen or persist or new concerns arise.  There are no Patient Instructions on file for this visit.   Annette Pierce R.

## 2017-08-28 ENCOUNTER — Encounter: Payer: Self-pay | Admitting: Family Medicine

## 2017-08-28 ENCOUNTER — Ambulatory Visit: Payer: BLUE CROSS/BLUE SHIELD | Admitting: Family Medicine

## 2017-08-28 NOTE — Progress Notes (Signed)
No show. Called late to cancel because she overslept.

## 2018-01-13 ENCOUNTER — Encounter: Payer: Self-pay | Admitting: Family Medicine

## 2018-01-13 ENCOUNTER — Ambulatory Visit: Payer: BLUE CROSS/BLUE SHIELD | Admitting: Family Medicine

## 2018-01-13 VITALS — BP 116/70 | HR 81 | Temp 98.5°F | Resp 12 | Ht 62.5 in | Wt 149.0 lb

## 2018-01-13 DIAGNOSIS — J029 Acute pharyngitis, unspecified: Secondary | ICD-10-CM

## 2018-01-13 DIAGNOSIS — R053 Chronic cough: Secondary | ICD-10-CM

## 2018-01-13 DIAGNOSIS — R05 Cough: Secondary | ICD-10-CM

## 2018-01-13 LAB — POCT RAPID STREP A (OFFICE): Rapid Strep A Screen: NEGATIVE

## 2018-01-13 MED ORDER — BENZONATATE 100 MG PO CAPS: 200.0000 mg | ORAL_CAPSULE | Freq: Two times a day (BID) | ORAL | 0 refills | Status: DC | PRN

## 2018-01-13 MED ORDER — MAGIC MOUTHWASH W/LIDOCAINE: 5.0000 mL | Freq: Three times a day (TID) | ORAL | 0 refills | Status: DC | PRN

## 2018-01-13 NOTE — Progress Notes (Signed)
ACUTE VISIT  HPI:  Chief Complaint  Patient presents with  . Sore Throat    for 3 days    [name] is a 32 y.o.female here today complaining of 3 days of sore throat.  Symptoms seem to be worse first thing in the morning. Exacerbated by swallowing and coughing.  Sore Throat   This is a new problem. The current episode started in the past 7 days. There has been no fever. The pain is moderate. Associated symptoms include coughing and neck pain (when having coughing spells.). Pertinent negatives include no abdominal pain, congestion, diarrhea, ear pain, headaches, hoarse voice, plugged ear sensation, shortness of breath, stridor, swollen glands, trouble swallowing or vomiting. She has had no exposure to strep or mono.     She has had nonproductive cough for about 3 weeks.  Body aches exacerbated by coughing spells.  She has not noted chest pain, dyspnea, or wheezing.  No Hx of recent travel. No sick contact. No known insect bite.  Hx of allergies: Seasonal allergies, she is taking OTC medications.  OTC medications for this problem: None  Symptoms otherwise stable.   Review of Systems  Constitutional: Positive for fatigue. Negative for activity change, appetite change, chills and fever.  HENT: Positive for sore throat. Negative for congestion, ear pain, hoarse voice, mouth sores, postnasal drip, rhinorrhea, sinus pressure, trouble swallowing and voice change.   Eyes: Negative for discharge, redness and itching.  Respiratory: Positive for cough. Negative for shortness of breath, wheezing and stridor.   Cardiovascular: Negative for leg swelling.  Gastrointestinal: Negative for abdominal pain, diarrhea, nausea and vomiting.  Musculoskeletal: Positive for myalgias and neck pain (when having coughing spells.). Negative for gait problem and joint swelling.  Skin: Negative for rash.  Allergic/Immunologic: Positive for environmental allergies.    Neurological: Negative for syncope, weakness and headaches.  Hematological: Negative for adenopathy. Does not bruise/bleed easily.      No current outpatient medications on file prior to visit.   No current facility-administered medications on file prior to visit.      Past Medical History:  Diagnosis Date  . Allergy   . Chicken pox   . Hay fever   . Irregular periods   . Urinary tract infection    No Known Allergies  Social History   Socioeconomic History  . Marital status: Single    Spouse name: None  . Number of children: None  . Years of education: None  . Highest education level: None  Social Needs  . Financial resource strain: None  . Food insecurity - worry: None  . Food insecurity - inability: None  . Transportation needs - medical: None  . Transportation needs - non-medical: None  Occupational History  . Occupation: Investment banker, corporate: [employer]  Tobacco Use  . Smoking status: Never Smoker  . Smokeless tobacco: Never Used  Substance and Sexual Activity  . Alcohol use: No    Alcohol/week: 0.0 oz  . Drug use: No  . Sexual activity: Yes    Partners: Male  Other Topics Concern  . None  Social History Narrative   Son age 78, works 3 rd shift    Vitals:   01/13/18 1215  BP: 116/70  Pulse: 81  Resp: 12  Temp: 98.5 F (36.9 C)  SpO2: 100%   Body mass index is 26.82 kg/m.   Physical Exam  Nursing note and vitals reviewed. Constitutional: She is oriented to person,  place, and time. She appears well-developed and well-nourished. She does not appear ill. No distress.  HENT:  Head: Normocephalic and atraumatic.  Mouth/Throat: Oropharynx is clear and moist and mucous membranes are normal.  Eyes: Conjunctivae are normal.  Neck: No muscular tenderness present. No edema and no erythema present.  Cardiovascular: Normal rate and regular rhythm.  No murmur heard. Respiratory: Effort normal and breath sounds normal. No stridor. No respiratory  distress.  Lymphadenopathy:       Head (right side): No submandibular adenopathy present.       Head (left side): No submandibular adenopathy present.    She has no cervical adenopathy.  Neurological: She is alert and oriented to person, place, and time. She has normal strength.  Skin: Skin is warm. No rash noted. No erythema.  Psychiatric: She has a normal mood and affect. Her speech is normal.  Well groomed, good eye contact.    ASSESSMENT AND PLAN:   Ms. Darl PikesSusan was seen today for sore throat.  Diagnoses and all orders for this visit:  Sore throat  -     POCT rapid strep A -     Culture, Group A Strep -     magic mouthwash w/lidocaine SOLN; Take 5 mLs by mouth 3 (three) times daily as needed for up to 10 days for mouth pain. 50 ml of diphenhydramine, alum and mag hydroxide, and lidocaine to make 150 ml  Acute pharyngitis, unspecified etiology  We discussed possible etiologies, including allergic and viral infection. Rapid strep here in the office negative. We will follow strep culture. For now she will continue symptomatic treatment with lozenges and Magic mouthwash. Instructed about warning signs. Follow-up as needed.  -     POCT rapid strep A -     Culture, Group A Strep -     magic mouthwash w/lidocaine SOLN; Take 5 mLs by mouth 3 (three) times daily as needed for up to 10 days for mouth pain. 50 ml of diphenhydramine, alum and mag hydroxide, and lidocaine to make 150 ml  Persistent cough  I does not seem related to sore throat. Possible causes discussed, including allergies, residual symptoms from prior URI, GERD among some. For now recommend symptomatic treatment with benzonatate. Further recommendations will be given according to imaging results.  -     DG Chest 2 View; Future -     benzonatate (TESSALON) 100 MG capsule; Take 2 capsules (200 mg total) by mouth 2 (two) times daily as needed for up to 10 days.     -Ms. Metta ClinesSusan Danielle Mcmurtry advised to seek  attention immediately if symptoms worsen or to follow if they persist or new concerns arise.       Shawna Wearing G. [place], MD  [facility]. [place] office.

## 2018-01-13 NOTE — Patient Instructions (Addendum)
A few things to remember from today's visit:   Sore throat - Plan: POCT rapid strep A, Culture, Group A Strep, magic mouthwash w/lidocaine SOLN  Persistent cough - Plan: DG Chest 2 View, benzonatate (TESSALON) 100 MG capsule  Acute pharyngitis, unspecified etiology   Symptomatic treatment: Over the counter Acetaminophen 500 mg and/or Ibuprofen (400-600 mg) if there is not contraindications; you can alternate in between both every 4-6 hours. Gargles with saline water and throat lozenges might also help. Cold fluids.    Seek prompt medical evaluation if you are having difficulty breathing, mouth swelling, throat closing up, not able to swallow liquids (drooling), skin rash/bruising, or worsening symptoms.  Please follow up in 2 weeks if not any better.   Today X ray was ordered.  This [facility] be done at [facility] [address] between 8 am and 5 pm: [address] [phone].   Please be sure medication list is accurate. If a new problem present, please set up appointment sooner than planned today.

## 2018-01-15 LAB — CULTURE, GROUP A STREP
MICRO NUMBER: 90250291
SPECIMEN QUALITY:: ADEQUATE

## 2018-01-19 ENCOUNTER — Encounter: Payer: Self-pay | Admitting: Family Medicine

## 2018-01-19 ENCOUNTER — Ambulatory Visit: Payer: BLUE CROSS/BLUE SHIELD | Admitting: Family Medicine

## 2018-01-19 VITALS — BP 120/74 | HR 86 | Temp 98.6°F | Resp 12 | Ht 62.5 in | Wt 147.2 lb

## 2018-01-19 DIAGNOSIS — J302 Other seasonal allergic rhinitis: Secondary | ICD-10-CM

## 2018-01-19 DIAGNOSIS — Z0000 Encounter for general adult medical examination without abnormal findings: Secondary | ICD-10-CM | POA: Diagnosis not present

## 2018-01-19 NOTE — Progress Notes (Signed)
HPI:   [name] is a 32 y.o. female, who is here today to establish care.  Former PCP: N/A Last preventive routine visit: A few years ago. She follows with her gynecologist regularly, [name].  I saw her recently for acute visit, sore throat, which has resolved.  Chronic medical problems: Seasonal allergies and abnormal pap smear.  She does not take any chronic medication.   Concerns today: She does not have any concerns today, she agrees with a CPE.   She lives with her father and her son.  She does not exercise regular She tries to follow a healthy diet.   Last eye exam last week, she wears contact lenses. Last dental exam many years ago.  She denies tobacco use, high alcohol intake, or illicit drug use.   Review of Systems  Constitutional: Negative for appetite change, fatigue, fever and unexpected weight change.  HENT: Positive for congestion and postnasal drip. Negative for hearing loss, mouth sores, sinus pain, sore throat, trouble swallowing and voice change.   Eyes: Negative for redness and visual disturbance.  Respiratory: Negative for cough, shortness of breath and wheezing.   Cardiovascular: Negative for chest pain and leg swelling.  Gastrointestinal: Negative for abdominal pain, nausea and vomiting.       No changes in bowel habits.  Endocrine: Negative for cold intolerance, heat intolerance, polydipsia, polyphagia and polyuria.  Genitourinary: Negative for decreased urine volume, dysuria, hematuria, vaginal bleeding and vaginal discharge.  Musculoskeletal: Negative for gait problem and neck pain.  Skin: Negative for color change and rash.  Neurological: Negative for syncope, weakness and headaches.  Hematological: Negative for adenopathy. Does not bruise/bleed easily.  Psychiatric/Behavioral: Negative for confusion and sleep disturbance. The patient is not nervous/anxious.   All other systems reviewed and are  negative.     No current outpatient medications on file prior to visit.   No current facility-administered medications on file prior to visit.      Past Medical History:  Diagnosis Date  . Allergy   . Chicken pox   . Hay fever   . Irregular periods   . Urinary tract infection    No Known Allergies  Family History  Problem Relation Age of Onset  . Hypertension Mother   . Diabetes Maternal Grandmother   . Hypertension Maternal Grandmother     Social History   Socioeconomic History  . Marital status: Single    Spouse name: None  . Number of children: None  . Years of education: None  . Highest education level: None  Social Needs  . Financial resource strain: None  . Food insecurity - worry: None  . Food insecurity - inability: None  . Transportation needs - medical: None  . Transportation needs - non-medical: None  Occupational History  . Occupation: Investment banker, corporatesales    Employer: [employer]  Tobacco Use  . Smoking status: Never Smoker  . Smokeless tobacco: Never Used  Substance and Sexual Activity  . Alcohol use: No    Alcohol/week: 0.0 oz  . Drug use: No  . Sexual activity: Yes    Partners: Male  Other Topics Concern  . None  Social History Narrative   Son age 711, works 3 rd shift    Vitals:   01/19/18 0755  BP: 120/74  Pulse: 86  Resp: 12  Temp: 98.6 F (37 C)  SpO2: 99%    Body mass index is 26.5 kg/m.   Wt Readings from Last 3  Encounters:  01/19/18 147 lb 4 oz (66.8 kg)  01/13/18 149 lb (67.6 kg)  07/28/17 156 lb 3.2 oz (70.9 kg)     Physical Exam  Nursing note and vitals reviewed. Constitutional: She is oriented to person, place, and time. She appears well-developed and well-nourished. No distress.  HENT:  Head: Normocephalic and atraumatic.  Right Ear: Hearing, tympanic membrane, external ear and ear canal normal.  Left Ear: Hearing, tympanic membrane, external ear and ear canal normal.  Mouth/Throat: Uvula is midline, oropharynx is  clear and moist and mucous membranes are normal.  Eyes: Conjunctivae and EOM are normal. Pupils are equal, round, and reactive to light.  Neck: No tracheal deviation present. No thyromegaly present.  Cardiovascular: Normal rate and regular rhythm.  No murmur heard. Pulses:      Dorsalis pedis pulses are 2+ on the right side, and 2+ on the left side.  Respiratory: Effort normal and breath sounds normal. No respiratory distress.  GI: Soft. She exhibits no mass. There is no hepatomegaly. There is no tenderness.  Genitourinary:  Genitourinary Comments: Deferred to gynecology.  Musculoskeletal: She exhibits no edema.  No major deformity or signs of synovitis appreciated.  Lymphadenopathy:    She has no cervical adenopathy.       Right: No supraclavicular adenopathy present.       Left: No supraclavicular adenopathy present.  Neurological: She is alert and oriented to person, place, and time. She has normal strength. No cranial nerve deficit. Coordination and gait normal.  Reflex Scores:      Bicep reflexes are 2+ on the right side and 2+ on the left side.      Patellar reflexes are 2+ on the right side and 2+ on the left side. Skin: Skin is warm. No rash noted. No erythema.  Psychiatric: She has a normal mood and affect. Her speech is normal.  Well groomed, good eye contact.    ASSESSMENT AND PLAN:  Ms. Deane was seen today for establish care and annual exam.  Diagnoses and all orders for this visit:  Routine general medical examination at a health care facility  We discussed the importance of regular physical activity and healthy diet for prevention of chronic illness and/or complications. Preventive guidelines reviewed. Vaccination up to date.  She will continue following with her gynecologist for female preventative care.  She is not sure if she will benefit from biometric labs in regard to health insurance premium. If she finds out that she does, we can arrange fasting labs.    Next CPE in 1-2 year.   Seasonal allergies  Stable. She does not feel like she needs pharmacologic treatment. Follow-up as needed.    Verita Kuroda G. [place], MD  [facility]. [place] office.

## 2018-01-19 NOTE — Patient Instructions (Addendum)
A few things to remember from today's visit:   Routine general medical examination at a health care facility  Today you have you routine preventive visit.  At least 150 minutes of moderate exercise per week, daily brisk walking for 15-30 min is a good exercise option. Healthy diet low in saturated (animal) fats and sweets and consisting of fresh fruits and vegetables, lean meats such as fish and white chicken and whole grains.  These are some of recommendations for screening depending of age and risk factors:   - Vaccines:  Tdap vaccine every 10 years.  Shingles vaccine recommended at age 60, could be given after 31 years of age but not sure about insurance coverage.   Pneumonia vaccines:  Prevnar 13 at 65 and Pneumovax at 66. Sometimes Pneumovax is giving earlier if history of smoking, lung disease,diabetes,kidney disease among some.    Screening for diabetes at age 40 and every 3 years.  Cervical cancer prevention:  Pap smear starts at 31 years of age and continues periodically until 31 years old in low risk women. Pap smear every 3 years between 21 and 29 years old. Pap smear every 3-5 years between women 30 and older if pap smear negative and HPV screening negative.   -Breast cancer: Mammogram: There is disagreement between experts about when to start screening in low risk asymptomatic female but recent recommendations are to start screening at 40 and not later than 31 years old , every 1-2 years and after 31 yo q 2 years. Screening is recommended until 31 years old but some women can continue screening depending of healthy issues.   Colon cancer screening: starts at 31 years old until 31 years old.  Cholesterol disorder screening at age 45 and every 3 years.  Also recommended:  1. Dental visit- Brush and floss your teeth twice daily; visit your dentist twice a year. 2. Eye doctor- Get an eye exam at least every 2 years. 3. Helmet use- Always wear a helmet when riding a  bicycle, motorcycle, rollerblading or skateboarding. 4. Safe sex- If you may be exposed to sexually transmitted infections, use a condom. 5. Seat belts- Seat belts can save your live; always wear one. 6. Smoke/Carbon Monoxide detectors- These detectors need to be installed on the appropriate level of your home. Replace batteries at least once a year. 7. Skin cancer- When out in the sun please cover up and use sunscreen 15 SPF or higher. 8. Violence- If anyone is threatening or hurting you, please tell your healthcare provider.  9. Drink alcohol in moderation- Limit alcohol intake to one drink or less per day. Never drink and drive.  Please be sure medication list is accurate. If a new problem present, please set up appointment sooner than planned today.        

## 2018-08-12 ENCOUNTER — Encounter: Payer: Self-pay | Admitting: Family Medicine

## 2018-08-12 ENCOUNTER — Ambulatory Visit: Payer: BLUE CROSS/BLUE SHIELD | Admitting: Family Medicine

## 2018-08-12 VITALS — BP 110/70 | HR 80 | Temp 98.1°F | Resp 12 | Ht 62.5 in | Wt 143.5 lb

## 2018-08-12 DIAGNOSIS — J029 Acute pharyngitis, unspecified: Secondary | ICD-10-CM

## 2018-08-12 DIAGNOSIS — K146 Glossodynia: Secondary | ICD-10-CM | POA: Diagnosis not present

## 2018-08-12 DIAGNOSIS — K148 Other diseases of tongue: Secondary | ICD-10-CM | POA: Diagnosis not present

## 2018-08-12 LAB — POCT RAPID STREP A (OFFICE): RAPID STREP A SCREEN: NEGATIVE

## 2018-08-12 MED ORDER — NYSTATIN 100000 UNIT/ML MT SUSP: 5.0000 mL | Freq: Three times a day (TID) | OROMUCOSAL | 0 refills | Status: DC

## 2018-08-12 NOTE — Patient Instructions (Addendum)
A few things to remember from today's visit:   Soreness of tongue - Plan: POC Rapid Strep A  Sore throat - Plan: POC Rapid Strep A  Tongue thrust - Plan: nystatin (MYCOSTATIN) 100000 UNIT/ML suspension  Oral Thrush, Adult Oral thrush is an infection in your mouth and throat. It causes white patches on your tongue and in your mouth. Follow these instructions at home: Helping with soreness  To lessen your pain: ? Drink cold liquids, like water and iced tea. ? Eat frozen ice pops or frozen juices. ? Eat foods that are easy to swallow, like gelatin and ice cream. ? Drink from a straw if the patches in your mouth are painful. General instructions   Take or use over-the-counter and prescription medicines only as told by your doctor. Medicine for oral thrush may be something to swallow, or it may be something to put on the infected area.  Eat plain yogurt that has live cultures in it. Read the label to make sure.  If you wear dentures: ? Take out your dentures before you go to bed. ? Brush them well. ? Soak them in a denture cleaner.  Rinse your mouth with warm salt-water many times a day. To make the salt-water mixture, completely dissolve 1/2-1 teaspoon of salt in 1 cup of warm water. Contact a doctor if:  Your problems are getting worse.  Your problems do not get better in less than 7 days with treatment.  Your infection is spreading. This may show as white patches on the skin outside of your mouth.  You are nursing your baby and you have redness and pain in the nipples. This information is not intended to replace advice given to you by your health care provider. Make sure you discuss any questions you have with your health care provider. Document Released: 01/29/2010 Document Revised: 07/29/2016 Document Reviewed: 07/29/2016 Elsevier Interactive Patient Education  2017 [employer].   Please be sure medication list is accurate. If a new problem present, please set up  appointment sooner than planned today.

## 2018-08-12 NOTE — Progress Notes (Signed)
ACUTE VISIT   HPI:  Chief Complaint  Patient presents with  . Rash    white rash on tongue that started yesterday, tongue is sore    [name] is a 32 y.o. female, who is here today complaining of whitish and sore tongue that she noted yesterday. She has mild sore throat. No sick contact or recent travel. She denies stridor or dysphasia. No oral lesions. She has not noted fever, chills, fatigue, body aches, cough, wheezing, or dyspnea.  She has not tried OTC medications. Symptoms seem to be stable. .   Review of Systems  Constitutional: Negative for activity change, appetite change, fatigue and fever.  HENT: Positive for congestion, postnasal drip and sore throat. Negative for ear pain, mouth sores, sinus pressure, trouble swallowing and voice change.   Respiratory: Negative for cough, shortness of breath and wheezing.   Gastrointestinal: Negative for abdominal pain, diarrhea, nausea and vomiting.  Musculoskeletal: Negative for myalgias and neck pain.  Skin: Negative for rash.  Allergic/Immunologic: Positive for environmental allergies.  Neurological: Negative for weakness and headaches.  Hematological: Negative for adenopathy. Does not bruise/bleed easily.      No current outpatient medications on file prior to visit.   No current facility-administered medications on file prior to visit.      Past Medical History:  Diagnosis Date  . Allergy   . Chicken pox   . Hay fever   . Irregular periods   . Urinary tract infection    No Known Allergies  Social History   Socioeconomic History  . Marital status: Single    Spouse name: Not on file  . Number of children: Not on file  . Years of education: Not on file  . Highest education level: Not on file  Occupational History  . Occupation: Investment banker, corporate: [employer]  Social Needs  . Financial resource strain: Not on file  . Food insecurity:    Worry: Not on file    Inability: Not  on file  . Transportation needs:    Medical: Not on file    Non-medical: Not on file  Tobacco Use  . Smoking status: Never Smoker  . Smokeless tobacco: Never Used  Substance and Sexual Activity  . Alcohol use: No    Alcohol/week: 0.0 standard drinks  . Drug use: No  . Sexual activity: Yes    Partners: Male  Lifestyle  . Physical activity:    Days per week: Not on file    Minutes per session: Not on file  . Stress: Not on file  Relationships  . Social connections:    Talks on phone: Not on file    Gets together: Not on file    Attends religious service: Not on file    Active member of club or organization: Not on file    Attends meetings of clubs or organizations: Not on file    Relationship status: Not on file  Other Topics Concern  . Not on file  Social History Narrative   Son age 75, works 3 rd shift    Vitals:   08/12/18 1120  BP: 110/70  Pulse: 80  Resp: 12  Temp: 98.1 F (36.7 C)  SpO2: 100%   Body mass index is 25.83 kg/m.    Physical Exam  Nursing note and vitals reviewed. Constitutional: She is oriented to person, place, and time. She appears well-developed and well-nourished. She does not appear ill. No distress.  HENT:  Head: Normocephalic and atraumatic.  Mouth/Throat: Uvula is midline and mucous membranes are normal. Posterior oropharyngeal erythema present.    Whitish layer on tongue with mild erythema. Small aphtha right soft palate.   Eyes: Conjunctivae are normal.  Cardiovascular: Normal rate and regular rhythm.  No murmur heard. Respiratory: Effort normal and breath sounds normal. No respiratory distress.  Lymphadenopathy:       Head (right side): No submandibular adenopathy present.       Head (left side): No submandibular adenopathy present.    She has no cervical adenopathy.  Neurological: She is alert and oriented to person, place, and time. She has normal strength. Gait normal.  Skin: Skin is warm. No rash noted. No erythema.    Psychiatric: She has a normal mood and affect.  Well groomed, good eye contact.      ASSESSMENT AND PLAN:   Ms. Annette Pierce was seen today for rash.  Diagnoses and all orders for this visit:  Soreness of tongue  We discussed possible etiologies. Instructed about warning signs.  -     POC Rapid Strep A  Sore throat  Rapid strep negative. Aphtha on soft palate may be causing symptom. Monitor for new symptoms. Throat lozenges and gargles with saline.  -     POC Rapid Strep A -     Culture, Group A Strep  Tongue thrust  Nystatin sl tid until resolves + 3 more days. F/U as needed.  -     nystatin (MYCOSTATIN) 100000 UNIT/ML suspension; Take 5 mLs (500,000 Units total) by mouth 3 (three) times daily for 14 days.     Return if symptoms worsen or fail to improve.     Betty G. [place], MD  [facility]. [place] office.

## 2018-08-14 LAB — CULTURE, GROUP A STREP
MICRO NUMBER:: 91152900
SPECIMEN QUALITY:: ADEQUATE

## 2018-08-17 ENCOUNTER — Other Ambulatory Visit: Payer: Self-pay | Admitting: *Deleted

## 2018-08-17 DIAGNOSIS — K148 Other diseases of tongue: Secondary | ICD-10-CM

## 2018-08-17 MED ORDER — NYSTATIN 100000 UNIT/ML MT SUSP: 5.0000 mL | Freq: Three times a day (TID) | OROMUCOSAL | 0 refills | Status: AC

## 2018-09-14 ENCOUNTER — Ambulatory Visit: Payer: BLUE CROSS/BLUE SHIELD | Admitting: Nurse Practitioner

## 2018-09-14 ENCOUNTER — Encounter: Payer: Self-pay | Admitting: Nurse Practitioner

## 2018-09-14 VITALS — BP 112/78 | HR 81 | Temp 98.4°F | Ht 62.5 in | Wt 144.0 lb

## 2018-09-14 DIAGNOSIS — M109 Gout, unspecified: Secondary | ICD-10-CM | POA: Diagnosis not present

## 2018-09-14 DIAGNOSIS — N926 Irregular menstruation, unspecified: Secondary | ICD-10-CM | POA: Insufficient documentation

## 2018-09-14 DIAGNOSIS — D649 Anemia, unspecified: Secondary | ICD-10-CM | POA: Insufficient documentation

## 2018-09-14 MED ORDER — NAPROXEN 500 MG PO TABS: 500.0000 mg | ORAL_TABLET | Freq: Two times a day (BID) | ORAL | 0 refills | Status: AC

## 2018-09-14 MED ORDER — COLCHICINE 0.6 MG PO TABS: ORAL_TABLET | ORAL | 0 refills | Status: AC

## 2018-09-14 NOTE — Progress Notes (Signed)
   Subjective:  Patient ID: Annette Pierce, female    DOB: 1986/03/16  Age: 32 y.o. MRN: [id number]  CC: Pain (patient is complaining right leg pain,cant put weight on it/ going on 5 days/ denied injury. )  Toe Pain   The incident occurred 3 to 5 days ago. There was no injury mechanism. The pain is present in the right foot. The quality of the pain is described as aching and shooting. The pain has been constant since onset. Associated symptoms include an inability to bear weight and muscle weakness. Pertinent negatives include no loss of motion, loss of sensation, numbness or tingling. The symptoms are aggravated by movement, palpation and weight bearing. She has tried nothing for the symptoms.  does not wear heels or narrow shoes or steel toe boots.  Reviewed past Medical, Social and Family history today.  No outpatient medications prior to visit.   No facility-administered medications prior to visit.     ROS See HPI  Objective:  BP 112/78   Pulse 81   Temp 98.4 F (36.9 C) (Oral)   Ht 5' 2.5" (1.588 m)   Wt 144 lb (65.3 kg)   SpO2 99%   BMI 25.92 kg/m   BP Readings from Last 3 Encounters:  09/14/18 112/78  08/12/18 110/70  01/19/18 120/74    Wt Readings from Last 3 Encounters:  09/14/18 144 lb (65.3 kg)  08/12/18 143 lb 8 oz (65.1 kg)  01/19/18 147 lb 4 oz (66.8 kg)    Physical Exam  Constitutional: She is oriented to person, place, and time. She appears well-developed and well-nourished.  Cardiovascular: Normal rate.  Pulmonary/Chest: Effort normal.  Musculoskeletal: She exhibits edema and tenderness. She exhibits no deformity.       Right foot: There is tenderness and swelling. There is normal range of motion, no deformity and no laceration.       Feet:  Neurological: She is alert and oriented to person, place, and time.  Skin: There is erythema.  Vitals reviewed.   No results found for: WBC, HGB, HCT, PLT, GLUCOSE, CHOL, TRIG, HDL, LDLDIRECT,  LDLCALC, ALT, AST, NA, K, CL, CREATININE, BUN, CO2, TSH, PSA, INR, GLUF, HGBA1C, MICROALBUR  No results found.  Assessment & Plan:   Annette Pierce was seen today for pain.  Diagnoses and all orders for this visit:  Podagra -     colchicine 0.6 MG tablet; Take 2tabs, then 1tab 1hr later. May repeat dose the next day -     naproxen (NAPROSYN) 500 MG tablet; Take 1 tablet (500 mg total) by mouth 2 (two) times daily with a meal.   I am having Annette Pierce. Boney start on colchicine and naproxen.  Meds ordered this encounter  Medications  . colchicine 0.6 MG tablet    Sig: Take 2tabs, then 1tab 1hr later. May repeat dose the next day    Dispense:  6 tablet    Refill:  0    Order Specific Question:   Supervising Provider    Answer:   Dianne Dun [[id number]]  . naproxen (NAPROSYN) 500 MG tablet    Sig: Take 1 tablet (500 mg total) by mouth 2 (two) times daily with a meal.    Dispense:  10 tablet    Refill:  0    Order Specific Question:   Supervising Provider    Answer:   Dianne Dun [[id number]]    Follow-up: No follow-ups on file.  Alysia Penna, NP

## 2018-09-14 NOTE — Patient Instructions (Signed)

## 2020-01-05 ENCOUNTER — Ambulatory Visit: Payer: Self-pay

## 2020-01-05 ENCOUNTER — Other Ambulatory Visit: Payer: BLUE CROSS/BLUE SHIELD

## 2022-08-27 ENCOUNTER — Encounter (HOSPITAL_COMMUNITY): Payer: Self-pay | Admitting: *Deleted

## 2022-08-27 ENCOUNTER — Other Ambulatory Visit: Payer: Self-pay

## 2022-08-27 ENCOUNTER — Ambulatory Visit (INDEPENDENT_AMBULATORY_CARE_PROVIDER_SITE_OTHER): Payer: 59

## 2022-08-27 ENCOUNTER — Ambulatory Visit (HOSPITAL_COMMUNITY)
Admission: EM | Admit: 2022-08-27 | Discharge: 2022-08-27 | Disposition: A | Discharge status: Home / Self Care | Payer: 59 | Attending: Internal Medicine | Admitting: Internal Medicine

## 2022-08-27 DIAGNOSIS — S63501A Unspecified sprain of right wrist, initial encounter: Secondary | ICD-10-CM

## 2022-08-27 DIAGNOSIS — M25531 Pain in right wrist: Secondary | ICD-10-CM

## 2022-08-27 MED ORDER — IBUPROFEN 800 MG PO TABS
ORAL_TABLET | ORAL | Status: AC
  Filled 2022-08-27: qty 1

## 2022-08-27 MED ORDER — IBUPROFEN 800 MG PO TABS
800.0000 mg | ORAL_TABLET | Freq: Once | ORAL | Status: AC
  Administered 2022-08-27: 800 mg via ORAL
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
## 2022-08-28 ENCOUNTER — Telehealth (HOSPITAL_COMMUNITY): Payer: Self-pay | Admitting: Emergency Medicine

## 2022-08-28 MED ORDER — TIZANIDINE HCL 4 MG PO TABS: 4.0000 mg | ORAL_TABLET | Freq: Four times a day (QID) | ORAL | 0 refills | Status: AC | PRN

## 2023-01-22 ENCOUNTER — Other Ambulatory Visit: Payer: Self-pay | Admitting: Obstetrics and Gynecology

## 2023-01-22 DIAGNOSIS — N6311 Unspecified lump in the right breast, upper outer quadrant: Secondary | ICD-10-CM

## 2023-01-27 ENCOUNTER — Ambulatory Visit
Admission: RE | Admit: 2023-01-27 | Discharge: 2023-01-27 | Disposition: A | Discharge status: Home / Self Care | Payer: 59 | Source: Ambulatory Visit | Attending: Obstetrics and Gynecology | Admitting: Obstetrics and Gynecology

## 2023-01-27 DIAGNOSIS — N6311 Unspecified lump in the right breast, upper outer quadrant: Secondary | ICD-10-CM
